# Patient Record
Sex: Male | Born: 1955 | Race: White | Hispanic: No | Marital: Married | Smoking: Never smoker
Health system: Southern US, Community
[De-identification: ages and names within clinical notes are randomized; demographics above are authoritative.]

## PROBLEM LIST (undated history)

## (undated) DIAGNOSIS — I4891 Unspecified atrial fibrillation: Secondary | ICD-10-CM

## (undated) DIAGNOSIS — K219 Gastro-esophageal reflux disease without esophagitis: Secondary | ICD-10-CM

## (undated) DIAGNOSIS — J45909 Unspecified asthma, uncomplicated: Secondary | ICD-10-CM

## (undated) DIAGNOSIS — M1990 Unspecified osteoarthritis, unspecified site: Secondary | ICD-10-CM

## (undated) HISTORY — DX: Unspecified osteoarthritis, unspecified site: M19.90

## (undated) HISTORY — PX: CARDIAC CATHETERIZATION: SHX172

## (undated) HISTORY — PX: ABLATION: SHX5711

## (undated) HISTORY — PX: VASECTOMY: SHX75

## (undated) HISTORY — DX: Unspecified asthma, uncomplicated: J45.909

---

## 2014-05-15 ENCOUNTER — Encounter (HOSPITAL_BASED_OUTPATIENT_CLINIC_OR_DEPARTMENT_OTHER): Payer: Self-pay | Admitting: Emergency Medicine

## 2014-05-15 ENCOUNTER — Emergency Department (HOSPITAL_BASED_OUTPATIENT_CLINIC_OR_DEPARTMENT_OTHER)
Admission: EM | Admit: 2014-05-15 | Discharge: 2014-05-16 | Disposition: A | Discharge status: Home / Self Care | Payer: 59 | Attending: Emergency Medicine | Admitting: Emergency Medicine

## 2014-05-15 DIAGNOSIS — I4891 Unspecified atrial fibrillation: Secondary | ICD-10-CM | POA: Diagnosis not present

## 2014-05-15 DIAGNOSIS — R002 Palpitations: Secondary | ICD-10-CM | POA: Insufficient documentation

## 2014-05-15 DIAGNOSIS — Z79899 Other long term (current) drug therapy: Secondary | ICD-10-CM | POA: Diagnosis not present

## 2014-05-15 DIAGNOSIS — K219 Gastro-esophageal reflux disease without esophagitis: Secondary | ICD-10-CM | POA: Diagnosis not present

## 2014-05-15 DIAGNOSIS — Z880 Allergy status to penicillin: Secondary | ICD-10-CM | POA: Diagnosis not present

## 2014-05-15 HISTORY — DX: Gastro-esophageal reflux disease without esophagitis: K21.9

## 2014-05-15 HISTORY — DX: Unspecified atrial fibrillation: I48.91

## 2014-05-15 LAB — CBC WITH DIFFERENTIAL/PLATELET
Basophils Absolute: 0 10*3/uL (ref 0.0–0.1)
Basophils Relative: 1 % (ref 0–1)
EOS PCT: 4 % (ref 0–5)
Eosinophils Absolute: 0.3 10*3/uL (ref 0.0–0.7)
HCT: 45.7 % (ref 39.0–52.0)
Hemoglobin: 16 g/dL (ref 13.0–17.0)
Lymphocytes Relative: 38 % (ref 12–46)
Lymphs Abs: 2.4 10*3/uL (ref 0.7–4.0)
MCH: 33.5 pg (ref 26.0–34.0)
MCHC: 35 g/dL (ref 30.0–36.0)
MCV: 95.6 fL (ref 78.0–100.0)
Monocytes Absolute: 1.1 10*3/uL — ABNORMAL HIGH (ref 0.1–1.0)
Monocytes Relative: 18 % — ABNORMAL HIGH (ref 3–12)
Neutro Abs: 2.5 10*3/uL (ref 1.7–7.7)
Neutrophils Relative %: 39 % — ABNORMAL LOW (ref 43–77)
PLATELETS: 205 10*3/uL (ref 150–400)
RBC: 4.78 MIL/uL (ref 4.22–5.81)
RDW: 13 % (ref 11.5–15.5)
WBC: 6.4 10*3/uL (ref 4.0–10.5)

## 2014-05-15 MED ORDER — DILTIAZEM HCL 25 MG/5ML IV SOLN
20.0000 mg | Freq: Once | INTRAVENOUS | Status: AC
  Administered 2014-05-16: 10 mg via INTRAVENOUS
  Filled 2014-05-15: qty 5

## 2014-05-15 NOTE — ED Notes (Signed)
Pt states that his HR went to 112 around 4pm.  Pt has hx of a-fib.  No CP or sob with this.

## 2014-05-15 NOTE — ED Notes (Signed)
Pt with hx of a-fib.  Pt states that he normally is in sinus rhythm while on his medication and can usually get back into NSR when he gets into a-fib by taking a cardizem prn and/or taking benadryl.  Pt states that he tried taking the benadryl today without success, he didn't take the cardizem

## 2014-05-15 NOTE — ED Provider Notes (Signed)
CSN: [id number]     Arrival date & time 05/15/14  2244 History   First MD Initiated Contact with Patient 05/15/14 2324   This chart was scribed for Hanley Seamen, MD by Tonye Royalty, ED Scribe. This patient was seen in room MH01/MH01 and the patient's care was started at 11:33 PM.     Chief Complaint  Patient presents with  . Palpitations   HPI  HPI Comments: Steven Montgomery is a 58 y.o. male with a history of atrial fibrillation who presents to the Emergency Department complaining of palpitations with onset 4PM. He describes this as a sensation of a rapid heartbeat.  He states his heart rate measured at 112 and BP was somewhat elevated so he took Warfarin, Propafenone, and Benadryl without remission of symptoms. He states his heart rate has decreased since but is still elevated, never below 95. He reports some transient cold sweat and lightheadedness that have resolved. He denies chest pain, shortness of breath, nausea or vomiting.   Past Medical History  Diagnosis Date  . Atrial fibrillation   . GERD (gastroesophageal reflux disease)    Past Surgical History  Procedure Laterality Date  . Vasectomy    . Cardiac catheterization     No family history on file. History  Substance Use Topics  . Smoking status: Never Smoker   . Smokeless tobacco: Not on file  . Alcohol Use: No    Review of Systems A complete 10 system review of systems was obtained and all systems are negative except as noted in the HPI and PMH.    Allergies  Crestor; Penicillins; and Sudafed  Home Medications   Prior to Admission medications   Medication Sig Start Date End Date Taking? Authorizing Provider  diltiazem (CARDIZEM) 30 MG tablet Take 30 mg by mouth.   Yes Historical Provider, MD  escitalopram (LEXAPRO) 20 MG tablet Take 20 mg by mouth daily.   Yes Historical Provider, MD  esomeprazole (NEXIUM) 40 MG packet Take 40 mg by mouth daily before breakfast.   Yes Historical Provider, MD  loratadine (CLARITIN)  10 MG tablet Take 10 mg by mouth daily. Takes generic   Yes Historical Provider, MD  PRENAT-FECBN-FEBISG-FA-FISHOIL PO Take by mouth.   Yes Historical Provider, MD  propafenone (RYTHMOL) 150 MG tablet Take 150 mg by mouth every 8 (eight) hours.   Yes Historical Provider, MD  ramelteon (ROZEREM) 8 MG tablet Take 8 mg by mouth at bedtime.   Yes Historical Provider, MD  warfarin (COUMADIN) 5 MG tablet Take 5 mg by mouth daily.  m, w, f, Saturday and Sunday. 2.5mg  on Tuesday and thursday   Yes Historical Provider, MD   BP 109/72  Pulse 72  Temp(Src) 97.7 F (36.5 C) (Oral)  Resp 10  Ht  (1.88 m)  Wt 215 lb (97.523 kg)  BMI 27.59 kg/m2  SpO2 94% Physical Exam  Nursing note and vitals reviewed.  General: Well-developed, well-nourished male in no acute distress; appearance consistent with age of record HENT: normocephalic; atraumatic Eyes: pupils equal, round and reactive to light; extraocular muscles intact Neck: supple Heart: irregular rhythm, tachycardia  Lungs: clear to auscultation bilaterally Abdomen: soft; nondistended; nontender; no masses or hepatosplenomegaly; bowel sounds present Extremities: No deformity; full range of motion; pulses normal Neurologic: Awake, alert and oriented; motor function intact in all extremities and symmetric; no facial droop Skin: Warm and dry; a few scattered scaly plaques Psychiatric: Normal mood and affect    ED Course  Procedures (including  critical care time)  MDM   Nursing notes and vitals signs, including pulse oximetry, reviewed.  Summary of this visit's results, reviewed by myself:  EKG Interpretation:  Date & Time: 05/15/2014 22:52  Rate: 107  Rhythm: atrial fibrillation with RVR  QRS Axis: indeterminate  Intervals: normal  ST/T Wave abnormalities: normal  Conduction Disutrbances:left anterior fascicular block  Narrative Interpretation:   Old EKG Reviewed: none available  Labs:  Results for orders placed during the  hospital encounter of 05/15/14 (from the past 24 hour(s))  CBC WITH DIFFERENTIAL     Status: Abnormal   Collection Time    05/15/14 11:50 PM      Result Value Ref Range   WBC 6.4  4.0 - 10.5 K/uL   RBC 4.78  4.22 - 5.81 MIL/uL   Hemoglobin 16.0  13.0 - 17.0 g/dL   HCT 16.1  09.6 - 04.5 %   MCV 95.6  78.0 - 100.0 fL   MCH 33.5  26.0 - 34.0 pg   MCHC 35.0  30.0 - 36.0 g/dL   RDW 40.9  81.1 - 91.4 %   Platelets 205  150 - 400 K/uL   Neutrophils Relative % 39 (*) 43 - 77 %   Neutro Abs 2.5  1.7 - 7.7 K/uL   Lymphocytes Relative 38  12 - 46 %   Lymphs Abs 2.4  0.7 - 4.0 K/uL   Monocytes Relative 18 (*) 3 - 12 %   Monocytes Absolute 1.1 (*) 0.1 - 1.0 K/uL   Eosinophils Relative 4  0 - 5 %   Eosinophils Absolute 0.3  0.0 - 0.7 K/uL   Basophils Relative 1  0 - 1 %   Basophils Absolute 0.0  0.0 - 0.1 K/uL  PROTIME-INR     Status: Abnormal   Collection Time    05/15/14 11:50 PM      Result Value Ref Range   Prothrombin Time 22.8 (*) 11.6 - 15.2 seconds   INR 2.01 (*) 0.00 - 1.49  BASIC METABOLIC PANEL     Status: Abnormal   Collection Time    05/15/14 11:50 PM      Result Value Ref Range   Sodium 141  137 - 147 mEq/L   Potassium 4.2  3.7 - 5.3 mEq/L   Chloride 104  96 - 112 mEq/L   CO2 22  19 - 32 mEq/L   Glucose, Bld 110 (*) 70 - 99 mg/dL   BUN 20  6 - 23 mg/dL   Creatinine, Ser 7.82  0.50 - 1.35 mg/dL   Calcium 8.9  8.4 - 95.6 mg/dL   GFR calc non Af Amer 65 (*) >90 mL/min   GFR calc Af Amer 76 (*) >90 mL/min   Anion gap 15  5 - 15  TROPONIN I     Status: None   Collection Time    05/15/14 11:50 PM      Result Value Ref Range   Troponin I <0.30  <0.30 ng/mL   12:49 AM Rate controlled in the 70s after IV Cardizem. Patient feels much better. He is not orthostatic.  I personally performed the services described in this documentation, which was scribed in my presence. The recorded information has been reviewed and is accurate.    Hanley Seamen, MD 05/16/14 [phone]

## 2014-05-16 LAB — PROTIME-INR
INR: 2.01 — ABNORMAL HIGH (ref 0.00–1.49)
PROTHROMBIN TIME: 22.8 s — AB (ref 11.6–15.2)

## 2014-05-16 LAB — BASIC METABOLIC PANEL
Anion gap: 15 (ref 5–15)
BUN: 20 mg/dL (ref 6–23)
CO2: 22 meq/L (ref 19–32)
CREATININE: 1.2 mg/dL (ref 0.50–1.35)
Calcium: 8.9 mg/dL (ref 8.4–10.5)
Chloride: 104 mEq/L (ref 96–112)
GFR calc non Af Amer: 65 mL/min — ABNORMAL LOW (ref >90)
GFR, EST AFRICAN AMERICAN: 76 mL/min — AB (ref >90)
Glucose, Bld: 110 mg/dL — ABNORMAL HIGH (ref 70–99)
Potassium: 4.2 mEq/L (ref 3.7–5.3)
Sodium: 141 mEq/L (ref 137–147)

## 2014-05-16 LAB — TROPONIN I: Troponin I: <0.3 ng/mL (ref <0.30)

## 2014-05-16 NOTE — Discharge Instructions (Signed)

## 2017-01-04 ENCOUNTER — Encounter (HOSPITAL_BASED_OUTPATIENT_CLINIC_OR_DEPARTMENT_OTHER): Payer: Self-pay | Admitting: *Deleted

## 2017-01-04 ENCOUNTER — Emergency Department (HOSPITAL_BASED_OUTPATIENT_CLINIC_OR_DEPARTMENT_OTHER): Payer: 59

## 2017-01-04 ENCOUNTER — Emergency Department (HOSPITAL_BASED_OUTPATIENT_CLINIC_OR_DEPARTMENT_OTHER)
Admission: EM | Admit: 2017-01-04 | Discharge: 2017-01-04 | Disposition: A | Discharge status: Home / Self Care | Payer: 59 | Attending: Emergency Medicine | Admitting: Emergency Medicine

## 2017-01-04 DIAGNOSIS — M79644 Pain in right finger(s): Secondary | ICD-10-CM | POA: Diagnosis not present

## 2017-01-04 MED ORDER — PREDNISONE 10 MG PO TABS: 40.0000 mg | ORAL_TABLET | Freq: Every day | ORAL | 0 refills | Status: AC

## 2017-01-04 NOTE — ED Provider Notes (Signed)
[facility] Provider Note   CSN: [id number] Arrival date & time: 01/04/17  1810   By signing my name below, I, Thelma Barge, attest that this documentation has been prepared under the direction and in the presence of [facility], PA-C. Electronically Signed: Thelma Barge, Scribe. 01/04/17. 9:20 PM.  History   Chief Complaint Chief Complaint  Patient presents with  . Finger Injury   The history is provided by the patient. No language interpreter was used.   HPI Comments: Steven Montgomery is a 61 y.o. male with a PMHx of AFIB on Coumadin, and arthritis, who presents to the Emergency Department complaining of gradually worsening pain in his right index finger that began last night. He has associated swelling and limited ROM secondary to his swelling. No recent falls or trauma to the area to precipitate his symptoms. He has applied Voltaren gel and taken tylenol with heat and ice with some relief. No h/o gout or RA. No recent increased EtOH or red meat consumption. Pt denies any known insect, arachnid, or tick bites. He denies any injury, numbness/tingling, weakness, changes to diet, fever, chills, headache, neck pain, back pain, abdominal pain, nausea, vomiting.   Past Medical History:  Diagnosis Date  . Atrial fibrillation (HCC)   . GERD (gastroesophageal reflux disease)     There are no active problems to display for this patient.   Past Surgical History:  Procedure Laterality Date  . CARDIAC CATHETERIZATION    . VASECTOMY         Home Medications    Prior to Admission medications   Medication Sig Start Date End Date Taking? Authorizing Provider  diltiazem (CARDIZEM) 30 MG tablet Take 30 mg by mouth.    Historical Provider, MD  escitalopram (LEXAPRO) 20 MG tablet Take 20 mg by mouth daily.    Historical Provider, MD  esomeprazole (NEXIUM) 40 MG packet Take 40 mg by mouth daily before breakfast.    Historical Provider, MD  loratadine (CLARITIN) 10 MG tablet Take 10  mg by mouth daily. Takes generic    Historical Provider, MD  predniSONE (DELTASONE) 10 MG tablet Take 4 tablets (40 mg total) by mouth daily. 01/04/17 01/09/17  Aadam Zhen A Dahiana Kulak, PA-C  propafenone (RYTHMOL) 150 MG tablet Take 150 mg by mouth every 8 (eight) hours.    Historical Provider, MD  ramelteon (ROZEREM) 8 MG tablet Take 8 mg by mouth at bedtime.    Historical Provider, MD  warfarin (COUMADIN) 5 MG tablet Take 5 mg by mouth daily.  m, w, f, Saturday and Sunday. 2.5mg  on Tuesday and thursday    Historical Provider, MD    Family History History reviewed. No pertinent family history.  Social History Social History  Substance Use Topics  . Smoking status: Never Smoker  . Smokeless tobacco: Never Used  . Alcohol use No     Allergies   Crestor [rosuvastatin]; Penicillins; and Sudafed [pseudoephedrine hcl]   Review of Systems Review of Systems  Constitutional: Negative for chills and fever.  Respiratory: Negative for shortness of breath.   Cardiovascular: Negative for chest pain.  Gastrointestinal: Negative for abdominal pain, nausea and vomiting.  Genitourinary: Negative for dysuria and hematuria.  Musculoskeletal: Positive for arthralgias and joint swelling. Negative for back pain and neck pain.  Skin: Negative for rash.  Neurological: Negative for numbness.     Physical Exam Updated Vital Signs BP 124/83 (BP Location: Right Arm)   Pulse 75   Temp 98.5 F (36.9 C) (Oral)  Resp 16   Ht  (1.88 m)   Wt 106.6 kg   SpO2 95%   BMI 30.17 kg/m   Physical Exam  Constitutional: He is oriented to person, place, and time. He appears well-developed and well-nourished.  HENT:  Head: Normocephalic and atraumatic.  Eyes: Conjunctivae are normal. Right eye exhibits no discharge. Left eye exhibits no discharge. No scleral icterus.  Neck: No JVD present. No tracheal deviation present.  Cardiovascular: Normal rate and intact distal pulses.   2+ radial pulses bilaterally    Pulmonary/Chest: Effort normal.  Abdominal: There is no tenderness.  Musculoskeletal:  Swelling to right first digit, limited ROM on extension, good grip strength, maximally TTP along MCP joint of right first digit, no erythema, no warmth, no deformity or crepitus noted 5/5 strength of bilateral wrists and digits against resistance  Neurological: He is alert and oriented to person, place, and time. No sensory deficit.  Fluent speech, no facial droop, sensation intact globally,   Skin: Skin is warm and dry. Capillary refill takes less than 2 seconds.  Psychiatric: He has a normal mood and affect. His behavior is normal.  Nursing note and vitals reviewed.    ED Treatments / Results  DIAGNOSTIC STUDIES: Oxygen Saturation is 96% on RA, normal by my interpretation.    COORDINATION OF CARE: 8:57 PM Discussed treatment plan with pt at bedside and pt agreed to plan.   Labs (all labs ordered are listed, but only abnormal results are displayed) Labs Reviewed - No data to display  EKG  EKG Interpretation None       Radiology Dg Hand Complete Right  Result Date: 01/04/2017 CLINICAL DATA:  Right index finger swelling and pain since last evening. EXAM: RIGHT HAND - COMPLETE 3+ VIEW COMPARISON:  None. FINDINGS: Mild joint space narrowing of the DIP joints of the second through fifth digits, first and fifth MCP joints with minimal spurring along the ulnar aspect of the second and third proximal phalanges. Small subchondral cysts noted of the second metacarpal head. Small marginal and extra-articular erosions of the head of the third middle phalanx. Slight remodeled appearance of the distal radioulnar joint may represent changes of ulnar impingement. No acute fracture identified. A nutrient foramen is seen simulating a fracture of the second proximal phalanx. IMPRESSION: Negative for acute fracture or dislocations. Osteoarthritis of the right hand. Electronically Signed   By: Tollie Eth M.D.    On: 01/04/2017 21:06    Procedures Procedures (including critical care time)  Medications Ordered in ED Medications - No data to display   Initial Impression / Assessment and Plan / ED Course  I have reviewed the triage vital signs and the nursing notes.  Pertinent labs & imaging results that were available during my care of the patient were reviewed by me and considered in my medical decision making (see chart for details).      patient with acute onset swelling and pain to right first digit, maximally tender along the MCP. Patient afebrile, with stable vital signs.  Topical anti-inflammatories have been helpful.  No erythema or warmth. No history of trauma. Neurovascularly intact.  Low suspicion septic arthritis, osteomyelitis, flexor tenosynovitis, or felon. Possible gout. Will start on prednisone pulse for 5 days. Patient will call his cardiologist to see if he needs to adjust his Coumadin dose.  He will continue with Voltaren gel and Tylenol 3 as needed.  Advised to use heat or ice additionally. He will follow up with his primary care  if symptoms do not improve.  Discussed strict ED return precautions. Pt verbalized understanding of and agreement with plan and is safe for discharge home at this time.   Final Clinical Impressions(s) / ED Diagnoses   Final diagnoses:  Finger pain, right    New Prescriptions Discharge Medication List as of 01/04/2017  9:22 PM    START taking these medications   Details  predniSONE (DELTASONE) 10 MG tablet Take 4 tablets (40 mg total) by mouth daily., Starting Fri 01/04/2017, Until Wed 01/09/2017, Print      I personally performed the services described in this documentation, which was scribed in my presence. The recorded information has been reviewed and is accurate.     Jeanie Sewer, PA-C 01/05/17 0130    Vanetta Mulders, MD 01/14/17 [phone]

## 2017-01-04 NOTE — ED Triage Notes (Signed)
Right index finger swelling and pain x 2-3 days.

## 2017-01-04 NOTE — Discharge Instructions (Signed)
Take prednisone once daily for the next 5 days. Continue to use Voltaren gel, Tylenol 3, ice and heat to the affected area. Follow-up with your primary care in the next 3-4 days for reevaluation. Call your cardiologist to see if you need any medication adjustments with your warfarin. Return to the ED if you develop any concerning symptoms

## 2017-01-04 NOTE — ED Notes (Signed)
c/o R index finger pain, swelling, weakness, and possible bruising, onset 2-3 days ago, pinpoints to b/w the R index finger MCP and PIP joint, worse with movement, no known injury, no redness or heat, some relief with voltaren gel  Alert, NAD, calm, interactive, resps e/u, speaking in clear complete sentences, no dyspnea noted, skin W&D, VSS, (denies: other sx).

## 2019-09-11 HISTORY — PX: KNEE ARTHROSCOPY W/ MENISCAL REPAIR: SHX1877

## 2020-04-28 ENCOUNTER — Emergency Department (HOSPITAL_BASED_OUTPATIENT_CLINIC_OR_DEPARTMENT_OTHER): Payer: 59

## 2020-04-28 ENCOUNTER — Other Ambulatory Visit: Payer: Self-pay

## 2020-04-28 ENCOUNTER — Encounter (HOSPITAL_BASED_OUTPATIENT_CLINIC_OR_DEPARTMENT_OTHER): Payer: Self-pay

## 2020-04-28 ENCOUNTER — Emergency Department (HOSPITAL_BASED_OUTPATIENT_CLINIC_OR_DEPARTMENT_OTHER)
Admission: EM | Admit: 2020-04-28 | Discharge: 2020-04-28 | Disposition: A | Discharge status: Home / Self Care | Payer: 59 | Attending: Emergency Medicine | Admitting: Emergency Medicine

## 2020-04-28 DIAGNOSIS — G4489 Other headache syndrome: Secondary | ICD-10-CM | POA: Insufficient documentation

## 2020-04-28 DIAGNOSIS — R519 Headache, unspecified: Secondary | ICD-10-CM | POA: Diagnosis present

## 2020-04-28 DIAGNOSIS — Z79899 Other long term (current) drug therapy: Secondary | ICD-10-CM | POA: Insufficient documentation

## 2020-04-28 MED ORDER — TRAMADOL HCL 50 MG PO TABS: 50.0000 mg | ORAL_TABLET | Freq: Four times a day (QID) | ORAL | 0 refills | Status: AC | PRN

## 2020-04-28 MED FILL — traMADol HCL 50 MG TABS: 50 | 3 days supply | Qty: 10 | Fill #0

## 2020-04-28 NOTE — ED Triage Notes (Signed)
Pt arrives with c/o headache waking up around 430 this morning. Pt reports he took 3 capsules of tylenol PTA, unsure of mg. Pt reports he is on a blood thinner and has history of A Fib.

## 2020-04-28 NOTE — ED Provider Notes (Signed)
[facility] EMERGENCY DEPARTMENT Provider Note   CSN: [id number] Arrival date & time: 04/28/20  7829     History Chief Complaint  Patient presents with  . Headache    Steven Montgomery is a 64 y.o. male.  Patient awoke this morning with headache pressure around the left eye.  And with feeling of some sinus pressure.  Patient states that he usually gets sinus infections related to allergies in the spring and fall.  No other complaints.  States headache is not severe.  Did take Tylenol at home.  Patient is on Coumadin for atrial fib.  Patient denies any visual changes any speech problems any numbness or any weakness.  No neck pain no neck stiffness no fevers.  Patient felt fine yesterday.        Past Medical History:  Diagnosis Date  . Atrial fibrillation (HCC)   . GERD (gastroesophageal reflux disease)     There are no problems to display for this patient.   Past Surgical History:  Procedure Laterality Date  . ABLATION    . CARDIAC CATHETERIZATION    . KNEE ARTHROSCOPY W/ MENISCAL REPAIR  2021  . VASECTOMY         No family history on file.  Social History   Tobacco Use  . Smoking status: Never Smoker  . Smokeless tobacco: Never Used  Substance Use Topics  . Alcohol use: No  . Drug use: No    Home Medications Prior to Admission medications   Medication Sig Start Date End Date Taking? Authorizing Provider  Acetaminophen-Codeine 300-30 MG tablet Take 1 tablet by mouth 3 (three) times daily as needed. 11/28/16  Yes [provider]  atorvastatin (LIPITOR) 40 MG tablet Take by mouth. 04/17/17  Yes [provider]  budesonide-formoterol (SYMBICORT) 160-4.5 MCG/ACT inhaler USE 2 INHALATIONS BY MOUTH  TWICE DAILY 08/24/19  Yes [provider]  diltiazem (TIAZAC) 180 MG 24 hr capsule Take by mouth. 08/11/19  Yes [provider]  dronedarone (MULTAQ) 400 MG tablet Take 1 tablet by mouth 2 (two) times daily. 09/21/19  Yes [provider]  gabapentin (NEURONTIN) 300 MG capsule TAKE ONE TAB NIGHTLY AS NEEDED. MAY TAKE ONE TABLET THREE TIMES A DAY AS NEEDED 03/07/20  Yes [provider]  ascorbic acid (VITAMIN C) 100 MG tablet Take by mouth.    [provider]  diltiazem (CARDIZEM) 30 MG tablet Take 30 mg by mouth.    [provider]  escitalopram (LEXAPRO) 20 MG tablet Take 20 mg by mouth daily.    [provider]  esomeprazole (NEXIUM) 40 MG packet Take 40 mg by mouth daily before breakfast.    [provider]  loratadine (CLARITIN) 10 MG tablet Take 10 mg by mouth daily. Takes generic    [provider]  propafenone (RYTHMOL) 150 MG tablet Take 150 mg by mouth every 8 (eight) hours.    [provider]  ramelteon (ROZEREM) 8 MG tablet Take 8 mg by mouth at bedtime.    [provider]  traMADol (ULTRAM) 50 MG tablet Take 1 tablet (50 mg total) by mouth every 6 (six) hours as needed. 04/28/20   Vanetta Mulders, MD  warfarin (COUMADIN) 5 MG tablet Take 5 mg by mouth daily. 5mg  m, w, f, Saturday and Sunday. 2.5mg  on Tuesday and thursday    [provider]  warfarin (COUMADIN) 5 MG tablet Take by mouth.    [provider]    Allergies  Crestor [rosuvastatin], Penicillins, and Sudafed [pseudoephedrine hcl]  Review of Systems   Review of Systems  Constitutional: Negative for chills and fever.  HENT: Positive for sinus pressure. Negative for congestion, rhinorrhea and sore throat.   Eyes: Negative for visual disturbance.  Respiratory: Negative for cough and shortness of breath.   Cardiovascular: Negative for chest pain and leg swelling.  Gastrointestinal: Negative for abdominal pain, diarrhea, nausea and vomiting.  Genitourinary: Negative for dysuria.  Musculoskeletal: Negative for back pain and neck pain.  Skin: Negative for rash.  Neurological: Positive for headaches. Negative for dizziness and light-headedness.    Hematological: Does not bruise/bleed easily.  Psychiatric/Behavioral: Negative for confusion.    Physical Exam Updated Vital Signs BP (!) 150/89 (BP Location: Right Arm)   Pulse 72   Temp 98.5 F (36.9 C) (Oral)   Resp 16   Ht 1.88 m (6\' 2" )   Wt 109.2 kg   SpO2 96%   BMI 30.90 kg/m   Physical Exam Vitals and nursing note reviewed.  Constitutional:      General: He is not in acute distress.    Appearance: Normal appearance. He is well-developed.  HENT:     Head: Normocephalic and atraumatic.  Eyes:     Extraocular Movements: Extraocular movements intact.     Conjunctiva/sclera: Conjunctivae normal.     Pupils: Pupils are equal, round, and reactive to light.  Cardiovascular:     Rate and Rhythm: Normal rate and regular rhythm.     Heart sounds: No murmur heard.   Pulmonary:     Effort: Pulmonary effort is normal. No respiratory distress.     Breath sounds: Normal breath sounds.  Abdominal:     Palpations: Abdomen is soft.     Tenderness: There is no abdominal tenderness.  Musculoskeletal:        General: Normal range of motion.     Cervical back: Neck supple.  Skin:    General: Skin is warm and dry.  Neurological:     General: No focal deficit present.     Mental Status: He is alert and oriented to person, place, and time.     Cranial Nerves: No cranial nerve deficit.     Sensory: No sensory deficit.     Motor: No weakness.     Coordination: Coordination normal.     ED Results / Procedures / Treatments   Labs (all labs ordered are listed, but only abnormal results are displayed) Labs Reviewed - No data to display  EKG None  Radiology CT Head Wo Contrast  Result Date: 04/28/2020 CLINICAL DATA:  Headache with intracranial hemorrhage suspected EXAM: CT HEAD WITHOUT CONTRAST TECHNIQUE: Contiguous axial images were obtained from the base of the skull through the vertex without intravenous contrast. COMPARISON:  Head CT report 11/06/2012 FINDINGS: Brain: No  evidence of acute infarction, hemorrhage, hydrocephalus, extra-axial collection or mass lesion/mass effect. Vascular: No hyperdense vessel or unexpected calcification. Skull: Normal. Negative for fracture or focal lesion. Sinuses/Orbits: No acute finding. IMPRESSION: Negative head CT. Electronically Signed   By: 11/08/2012 M.D.   On: 04/28/2020 09:32    Procedures Procedures (including critical care time)  Medications Ordered in ED Medications - No data to display  ED Course  I have reviewed the triage vital signs and the nursing notes.  Pertinent labs & imaging results that were available during my care of the patient were reviewed by me and considered in my medical decision making (see chart for details).  MDM Rules/Calculators/A&P                          Narcotic database reviewed.  Patient does occasionally receive pain medication.  Last prescription was end of June.  For 10 days.  Head CT shows no acute abnormalities no evidence of any head bleed.  Also no evidence of any chronic or acute sinusitis.  Recommend Tylenol and will give a short course of tramadol for additional pain relief.  Patient is retired does not need a work note.  Patient nontoxic no acute distress.  No concerns for meningitis.  No concerns for stroke.     Final Clinical Impression(s) / ED Diagnoses Final diagnoses:  Other headache syndrome    Rx / DC Orders ED Discharge Orders         Ordered    traMADol (ULTRAM) 50 MG tablet  Every 6 hours PRN        04/28/20 1024           Vanetta Mulders, MD 04/28/20 1027

## 2020-04-28 NOTE — Discharge Instructions (Addendum)
CT head without any acute findings.  Sinuses were normal.  I take the tramadol as a supplement to the Tylenol as needed for more severe pain.  Follow-up with your doctors if headache does not resolve additional work-up will be required.  Return for any new or worse symptoms.

## 2020-08-02 ENCOUNTER — Emergency Department (HOSPITAL_BASED_OUTPATIENT_CLINIC_OR_DEPARTMENT_OTHER)
Admission: EM | Admit: 2020-08-02 | Discharge: 2020-08-02 | Disposition: A | Discharge status: Home / Self Care | Payer: 59 | Attending: Emergency Medicine | Admitting: Emergency Medicine

## 2020-08-02 ENCOUNTER — Other Ambulatory Visit: Payer: Self-pay

## 2020-08-02 ENCOUNTER — Encounter (HOSPITAL_BASED_OUTPATIENT_CLINIC_OR_DEPARTMENT_OTHER): Payer: Self-pay | Admitting: *Deleted

## 2020-08-02 DIAGNOSIS — Y93G1 Activity, food preparation and clean up: Secondary | ICD-10-CM | POA: Diagnosis not present

## 2020-08-02 DIAGNOSIS — W268XXA Contact with other sharp object(s), not elsewhere classified, initial encounter: Secondary | ICD-10-CM | POA: Insufficient documentation

## 2020-08-02 DIAGNOSIS — Z79899 Other long term (current) drug therapy: Secondary | ICD-10-CM | POA: Diagnosis not present

## 2020-08-02 DIAGNOSIS — S6991XA Unspecified injury of right wrist, hand and finger(s), initial encounter: Secondary | ICD-10-CM | POA: Diagnosis present

## 2020-08-02 DIAGNOSIS — Z7901 Long term (current) use of anticoagulants: Secondary | ICD-10-CM | POA: Diagnosis not present

## 2020-08-02 DIAGNOSIS — Z23 Encounter for immunization: Secondary | ICD-10-CM | POA: Insufficient documentation

## 2020-08-02 DIAGNOSIS — S61011A Laceration without foreign body of right thumb without damage to nail, initial encounter: Secondary | ICD-10-CM | POA: Insufficient documentation

## 2020-08-02 MED ORDER — CEPHALEXIN 500 MG PO CAPS: 500.0000 mg | ORAL_CAPSULE | Freq: Four times a day (QID) | ORAL | 0 refills | Status: DC

## 2020-08-02 MED ORDER — TETANUS-DIPHTH-ACELL PERTUSSIS 5-2.5-18.5 LF-MCG/0.5 IM SUSY
0.5000 mL | PREFILLED_SYRINGE | Freq: Once | INTRAMUSCULAR | Status: AC
  Administered 2020-08-02: 0.5 mL via INTRAMUSCULAR
  Filled 2020-08-02: qty 0.5

## 2020-08-02 MED ORDER — LIDOCAINE HCL (PF) 1 % IJ SOLN
5.0000 mL | Freq: Once | INTRAMUSCULAR | Status: DC
  Filled 2020-08-02: qty 5

## 2020-08-02 NOTE — ED Triage Notes (Signed)
C/o right thumb injury by metal slicer x 10 mins ago

## 2020-08-02 NOTE — Discharge Instructions (Addendum)
WOUND CARE Please return in 7  days to have your stitches/staples removed or sooner if you have concerns.  Keep area clean and dry for 24 hours. Do not remove bandage, if applied.  After 24 hours, remove bandage and wash wound gently with mild soap and warm water. Reapply a new bandage after cleaning wound, if directed.  Continue daily cleansing with soap and water until stitches/staples are removed.  Do not apply any ointments or creams to the wound while stitches/staples are in place, as this may cause delayed healing.  Notify the office if you experience any of the following signs of infection: Swelling, redness, pus drainage, streaking, fever >101.0 F  Notify the office if you experience excessive bleeding that does not stop after 15-20 minutes of constant, firm pressure. Take your antibiotics as directed, you do have a penicillin allergy but if you have used amoxicillin and cephalosporins before the Keflex should be fine, if you do start having a reaction to it please stop taking and come back to the emergency department. Please come back to the emerge department for any new or worsening concerning symptoms.

## 2020-08-02 NOTE — ED Provider Notes (Addendum)
[facility] EMERGENCY DEPARTMENT Provider Note   CSN: [id number] Arrival date & time: 08/02/20  1750     History Chief Complaint  Patient presents with  . Finger Injury    Steven Montgomery is a 64 y.o. male with pertinent past medical history of A. fib on warfarin that presents the emerge department today for right thumb injury.  Patient states that he was preparing Thanksgiving dinner and got injured by a metal slicer, this happened about an hour ago.  Patient states that bleeding was controlled, did thoroughly wash it under the sink.  Patient states that he has some pain in that area, no numbness or tingling.  Able to bend thumb in all directions, no weakness.  Denies any prior injury to the thumb.  Patient has not been updated on his tetanus for the last 10 years.  Denies any prior injury.  HPI     Past Medical History:  Diagnosis Date  . Atrial fibrillation (HCC)   . GERD (gastroesophageal reflux disease)     There are no problems to display for this patient.   Past Surgical History:  Procedure Laterality Date  . ABLATION    . CARDIAC CATHETERIZATION    . KNEE ARTHROSCOPY W/ MENISCAL REPAIR  2021  . VASECTOMY         No family history on file.  Social History   Tobacco Use  . Smoking status: Never Smoker  . Smokeless tobacco: Never Used  Substance Use Topics  . Alcohol use: No  . Drug use: No    Home Medications Prior to Admission medications   Medication Sig Start Date End Date Taking? Authorizing Provider  Acetaminophen-Codeine 300-30 MG tablet Take 1 tablet by mouth 3 (three) times daily as needed. 11/28/16   [provider]  ascorbic acid (VITAMIN C) 100 MG tablet Take by mouth.    [provider]  atorvastatin (LIPITOR) 40 MG tablet Take by mouth. 04/17/17   [provider]  budesonide-formoterol (SYMBICORT) 160-4.5 MCG/ACT inhaler USE 2 INHALATIONS BY MOUTH  TWICE DAILY 08/24/19   [provider]  cephALEXin  (KEFLEX) 500 MG capsule Take 1 capsule (500 mg total) by mouth 4 (four) times daily. 08/02/20   Farrel Gordon, PA-C  diltiazem (CARDIZEM) 30 MG tablet Take 30 mg by mouth.    [provider]  diltiazem (TIAZAC) 180 MG 24 hr capsule Take by mouth. 08/11/19   [provider]  dronedarone (MULTAQ) 400 MG tablet Take 1 tablet by mouth 2 (two) times daily. 09/21/19   [provider]  escitalopram (LEXAPRO) 20 MG tablet Take 20 mg by mouth daily.    [provider]  esomeprazole (NEXIUM) 40 MG packet Take 40 mg by mouth daily before breakfast.    [provider]  gabapentin (NEURONTIN) 300 MG capsule TAKE ONE TAB NIGHTLY AS NEEDED. MAY TAKE ONE TABLET THREE TIMES A DAY AS NEEDED 03/07/20   [provider]  loratadine (CLARITIN) 10 MG tablet Take 10 mg by mouth daily. Takes generic    [provider]  propafenone (RYTHMOL) 150 MG tablet Take 150 mg by mouth every 8 (eight) hours.    [provider]  ramelteon (ROZEREM) 8 MG tablet Take 8 mg by mouth at bedtime.    [provider]  traMADol (ULTRAM) 50 MG tablet Take 1 tablet (50 mg total) by mouth every 6 (six) hours as needed. 04/28/20   Vanetta Mulders, MD  warfarin (COUMADIN) 5 MG tablet Take 5  mg by mouth daily. 5mg  m, w, f, Saturday and Sunday. 2.5mg  on Tuesday and thursday    [provider]  warfarin (COUMADIN) 5 MG tablet Take by mouth.    [provider]    Allergies    Crestor [rosuvastatin], Penicillins, and Sudafed [pseudoephedrine hcl]  Review of Systems   Review of Systems  Constitutional: Negative for diaphoresis, fatigue and fever.  Eyes: Negative for visual disturbance.  Respiratory: Negative for shortness of breath.   Cardiovascular: Negative for chest pain.  Gastrointestinal: Negative for nausea and vomiting.  Musculoskeletal: Negative for back pain and myalgias.  Skin: Positive for wound. Negative for color change, pallor and rash.    Neurological: Negative for syncope, weakness, light-headedness, numbness and headaches.  Psychiatric/Behavioral: Negative for behavioral problems and confusion.    Physical Exam Updated Vital Signs BP (!) 145/81 (BP Location: Left Arm)   Pulse 81   Temp 99.1 F (37.3 C) (Oral)   Resp 16   Ht 6\' 2"  (1.88 m)   Wt 108.9 kg   SpO2 97%   BMI 30.81 kg/m   Physical Exam Constitutional:      General: He is not in acute distress.    Appearance: Normal appearance. He is not ill-appearing, toxic-appearing or diaphoretic.  HENT:     Head: Normocephalic and atraumatic.  Eyes:     Extraocular Movements: Extraocular movements intact.     Pupils: Pupils are equal, round, and reactive to light.  Cardiovascular:     Rate and Rhythm: Normal rate and regular rhythm.     Pulses: Normal pulses.  Pulmonary:     Effort: Pulmonary effort is normal.     Breath sounds: Normal breath sounds.  Musculoskeletal:        General: Normal range of motion.     Right hand: Laceration present.       Hands:     Comments: Half a centimeter laceration to lateral right thumb, bleeding has been controlled.  Normal sensation and strength to all joints of thumb and hand and wrist.  No bony tenderness.  Cap refill less than 2 seconds.  Radial pulse 2+.  Skin:    General: Skin is warm and dry.     Capillary Refill: Capillary refill takes less than 2 seconds.  Neurological:     General: No focal deficit present.     Mental Status: He is alert and oriented to person, place, and time.  Psychiatric:        Mood and Affect: Mood normal.        Behavior: Behavior normal.        Thought Content: Thought content normal.     ED Results / Procedures / Treatments   Labs (all labs ordered are listed, but only abnormal results are displayed) Labs Reviewed - No data to display  EKG None  Radiology No results found.  Procedures .Marland Kitchen.Laceration Repair  Date/Time: 08/02/2020 7:10 PM Performed by: Farrel GordonPatel, Antoinetta Berrones,  PA-C Authorized by: Farrel GordonPatel, Tavarious Freel, PA-C   Consent:    Consent obtained:  Verbal   Consent given by:  Patient   Risks discussed:  Infection, need for additional repair, pain, poor cosmetic result and poor wound healing   Alternatives discussed:  No treatment and delayed treatment Universal protocol:    Procedure explained and questions answered to patient or proxy's satisfaction: yes     Relevant documents present and verified: yes     Test results available and properly labeled: yes     Imaging  studies available: yes     Required blood products, implants, devices, and special equipment available: yes     Site/side marked: yes     Immediately prior to procedure, a time out was called: yes     Patient identity confirmed:  Verbally with patient Anesthesia (see MAR for exact dosages):    Anesthesia method:  None Laceration details:    Location:  Finger   Finger location:  R thumb   Length (cm):  0.5   Depth (mm):  1 Repair type:    Repair type:  Simple Pre-procedure details:    Preparation:  Imaging obtained to evaluate for foreign bodies Exploration:    Hemostasis achieved with:  Direct pressure   Wound exploration: wound explored through full range of motion and entire depth of wound probed and visualized     Wound extent: no foreign bodies/material noted and no vascular damage noted     Contaminated: no   Treatment:    Amount of cleaning:  Extensive   Irrigation solution:  Sterile water Skin repair:    Repair method:  Sutures   Suture size:  4-0   Suture material:  Nylon   Suture technique:  Simple interrupted   Number of sutures:  2 Approximation:    Approximation:  Close Post-procedure details:    Dressing:  Open (no dressing) Comments:     PA- C Student performed under my supervision.   (including critical care time)  Medications Ordered in ED Medications  lidocaine (PF) (XYLOCAINE) 1 % injection 5 mL (5 mLs Infiltration Not Given 08/02/20 1942)  Tdap  (BOOSTRIX) injection 0.5 mL (0.5 mLs Intramuscular Given 08/02/20 1939)    ED Course  I have reviewed the triage vital signs and the nursing notes.  Pertinent labs & imaging results that were available during my care of the patient were reviewed by me and considered in my medical decision making (see chart for details).    MDM Rules/Calculators/A&P                         Steven Montgomery is a 64 y.o. male with pertinent past medical history of A. fib on warfarin that presents the emerge department today for right thumb injury.  Patient with half centimeter laceration, very superficial to right thumb.  Bleeding has been controlled with pressure and tourniquet.  Patient is distally neurovascularly intact.do not think we need plain films at this time, no concern for retained object since patient sliced himself with a metal slicer and no concern for fracture since patient has normal range of motion to thumb with no pain.  After moderate irrigation PA  student placed 2 sutures under my supervision , on repeat exam patient is still distally neurovascularly intact.  Tetanus updated.  Patient placed on antibiotics and will return to get sutures removed and have wound check.  Symptomatic treatment discussed, strict precautions given.  Patient states that he does have allergic reaction to penicillin, when he was very little.  However has taken amoxicillin in the past and he thinks he has also taken Keflex in the past, will prescribe Keflex at this time.  Side effects discussed including allergic reaction.  Doubt need for further emergent work up at this time. I explained the diagnosis and have given explicit precautions to return to the ER including for any other new or worsening symptoms. The patient understands and accepts the medical plan as it's been dictated and I have answered their  questions. Discharge instructions concerning home care and prescriptions have been given. The patient is STABLE and is  discharged to home in good condition.   Final Clinical Impression(s) / ED Diagnoses Final diagnoses:  Laceration of right thumb without foreign body without damage to nail, initial encounter    Rx / DC Orders ED Discharge Orders         Ordered    cephALEXin (KEFLEX) 500 MG capsule  4 times daily        08/02/20 1943               Farrel Gordon, PA-C 08/02/20 1952    Tilden Fossa, MD 08/02/20 2101

## 2020-08-09 ENCOUNTER — Emergency Department (HOSPITAL_BASED_OUTPATIENT_CLINIC_OR_DEPARTMENT_OTHER)
Admission: EM | Admit: 2020-08-09 | Discharge: 2020-08-09 | Disposition: A | Discharge status: Home / Self Care | Payer: 59 | Attending: Emergency Medicine | Admitting: Emergency Medicine

## 2020-08-09 ENCOUNTER — Other Ambulatory Visit: Payer: Self-pay

## 2020-08-09 ENCOUNTER — Encounter (HOSPITAL_BASED_OUTPATIENT_CLINIC_OR_DEPARTMENT_OTHER): Payer: Self-pay | Admitting: *Deleted

## 2020-08-09 DIAGNOSIS — I4891 Unspecified atrial fibrillation: Secondary | ICD-10-CM | POA: Diagnosis not present

## 2020-08-09 DIAGNOSIS — Z79899 Other long term (current) drug therapy: Secondary | ICD-10-CM | POA: Diagnosis not present

## 2020-08-09 DIAGNOSIS — Z4802 Encounter for removal of sutures: Secondary | ICD-10-CM | POA: Diagnosis present

## 2020-08-09 DIAGNOSIS — W274XXD Contact with kitchen utensil, subsequent encounter: Secondary | ICD-10-CM | POA: Diagnosis not present

## 2020-08-09 NOTE — ED Triage Notes (Signed)
Here for suture removal right thumb.

## 2020-08-09 NOTE — ED Provider Notes (Signed)
[facility] EMERGENCY DEPARTMENT Provider Note   CSN: [id number] Arrival date & time: 08/09/20  1423     History Chief Complaint  Patient presents with  . Suture / Staple Removal    Steven Montgomery is a 64 y.o. male who presents for suture removal 7 days post-placement for 0.5 cm laceration to the right thumb from metal slicer while preparing food in the kitchen.   Patient has completed course of cephalexin, Tetanus was updated at ED visit on 08/02/2020.   Patient denies pain, redness, swelling, drainage of the right thumb or around the wound. Denies fevers or chills.  Patient does state that one of the sutures came loose and fell out today while he was in the shower, and was washed down the drain.   Patient with hx of Afib, on warfarin. Hx of GERD, cardiac cath, knee arthroscopy.  HPI     Past Medical History:  Diagnosis Date  . Atrial fibrillation (HCC)   . GERD (gastroesophageal reflux disease)     There are no problems to display for this patient.   Past Surgical History:  Procedure Laterality Date  . ABLATION    . CARDIAC CATHETERIZATION    . KNEE ARTHROSCOPY W/ MENISCAL REPAIR  2021  . VASECTOMY         No family history on file.  Social History   Tobacco Use  . Smoking status: Never Smoker  . Smokeless tobacco: Never Used  Substance Use Topics  . Alcohol use: No  . Drug use: No    Home Medications Prior to Admission medications   Medication Sig Start Date End Date Taking? Authorizing Provider  Acetaminophen-Codeine 300-30 MG tablet Take 1 tablet by mouth 3 (three) times daily as needed. 11/28/16   [provider]  ascorbic acid (VITAMIN C) 100 MG tablet Take by mouth.    [provider]  atorvastatin (LIPITOR) 40 MG tablet Take by mouth. 04/17/17   [provider]  budesonide-formoterol (SYMBICORT) 160-4.5 MCG/ACT inhaler USE 2 INHALATIONS BY MOUTH  TWICE DAILY 08/24/19   [provider]  cephALEXin  (KEFLEX) 500 MG capsule Take 1 capsule (500 mg total) by mouth 4 (four) times daily. 08/02/20   Farrel Gordon, PA-C  diltiazem (CARDIZEM) 30 MG tablet Take 30 mg by mouth.    [provider]  diltiazem (TIAZAC) 180 MG 24 hr capsule Take by mouth. 08/11/19   [provider]  dronedarone (MULTAQ) 400 MG tablet Take 1 tablet by mouth 2 (two) times daily. 09/21/19   [provider]  escitalopram (LEXAPRO) 20 MG tablet Take 20 mg by mouth daily.    [provider]  esomeprazole (NEXIUM) 40 MG packet Take 40 mg by mouth daily before breakfast.    [provider]  gabapentin (NEURONTIN) 300 MG capsule TAKE ONE TAB NIGHTLY AS NEEDED. MAY TAKE ONE TABLET THREE TIMES A DAY AS NEEDED 03/07/20   [provider]  loratadine (CLARITIN) 10 MG tablet Take 10 mg by mouth daily. Takes generic    [provider]  propafenone (RYTHMOL) 150 MG tablet Take 150 mg by mouth every 8 (eight) hours.    [provider]  ramelteon (ROZEREM) 8 MG tablet Take 8 mg by mouth at bedtime.    [provider]  traMADol (ULTRAM) 50 MG tablet Take 1 tablet (50 mg total) by mouth every 6 (six) hours as needed. 04/28/20   Vanetta Mulders, MD  warfarin (COUMADIN) 5 MG tablet Take 5 mg by  mouth daily. 5mg  m, w, f, Saturday and Sunday. 2.5mg  on Tuesday and thursday    [provider]  warfarin (COUMADIN) 5 MG tablet Take by mouth.    [provider]    Allergies    Crestor [rosuvastatin], Penicillins, and Sudafed [pseudoephedrine hcl]  Review of Systems   Review of Systems  Constitutional: Negative.   Respiratory: Negative.   Cardiovascular: Negative.   Gastrointestinal: Negative.   Musculoskeletal: Negative.   Skin: Positive for wound.       Wound right thumb, one suture present, one fell out spontaneously today at home.   Hematological: Bruises/bleeds easily.       On Warfarin    Physical Exam Updated Vital Signs BP (!) 145/82    Pulse 80   Temp 98.3 F (36.8 C) (Oral)   Resp 18   Ht 6\' 2"  (1.88 m)   Wt 108.9 kg   SpO2 94%   BMI 30.82 kg/m   Physical Exam Vitals and nursing note reviewed.  Constitutional:      Appearance: He is not ill-appearing.  HENT:     Head: Normocephalic and atraumatic.     Mouth/Throat:     Mouth: Mucous membranes are moist.     Pharynx: No oropharyngeal exudate or posterior oropharyngeal erythema.  Eyes:     General:        Right eye: No discharge.        Left eye: No discharge.     Conjunctiva/sclera: Conjunctivae normal.  Cardiovascular:     Rate and Rhythm: Normal rate and regular rhythm.     Pulses: Normal pulses.     Heart sounds: Normal heart sounds. No murmur heard.   Pulmonary:     Effort: Pulmonary effort is normal. No respiratory distress.     Breath sounds: Normal breath sounds. No wheezing or rales.  Abdominal:     General: There is no distension.     Tenderness: There is no abdominal tenderness.  Musculoskeletal:        General: No deformity.       Hands:     Cervical back: Neck supple.     Right lower leg: No edema.     Left lower leg: No edema.     Comments: Only one suture present in the right thumb laceration - per patient other suture fell out spontaneously in the shower this morning. There are no signs of retained suture in the wound.   Skin:    General: Skin is warm and dry.     Capillary Refill: Capillary refill takes less than 2 seconds.  Neurological:     General: No focal deficit present.     Mental Status: He is alert and oriented to person, place, and time.  Psychiatric:        Mood and Affect: Mood normal.     ED Results / Procedures / Treatments   Labs (all labs ordered are listed, but only abnormal results are displayed) Labs Reviewed - No data to display  EKG None  Radiology No results found.  Procedures .Suture Removal  Date/Time: 08/09/2020 2:34 PM Performed by: , PA-C Authorized by: 08/11/2020, PA-C   Consent:    Consent obtained:  Verbal   Consent given by:  Patient   Risks discussed:  Bleeding, wound separation and pain   Alternatives discussed:  No treatment Location:    Location:  Upper extremity   Upper extremity location:  Hand   Hand  location:  R thumb Procedure details:    Wound appearance:  No signs of infection, good wound healing and clean   Number of sutures removed:  1 (Per patient, one suture fell out on its own this morning while in the shower. At time of my exam, there is only 1 suture present in the wound.) Post-procedure details:    Post-removal:  Band-Aid applied   Patient tolerance of procedure:  Tolerated well, no immediate complications   (including critical care time)  Medications Ordered in ED Medications - No data to display  ED Course  I have reviewed the triage vital signs and the nursing notes.  Pertinent labs & imaging results that were available during my care of the patient were reviewed by me and considered in my medical decision making (see chart for details).    MDM Rules/Calculators/A&P                         64 y/o male who presents for suture removal from right thumb.   Sutures removed as above. Patient tolerated procedure well.   Infection precautions discussed, return precautions given. Patient is well appearing and is stable for discharge.   Final Clinical Impression(s) / ED Diagnoses Final diagnoses:  None    Rx / DC Orders ED Discharge Orders    None       Paris Lore, PA-C 08/09/20 1456    Geoffery Lyons, MD 08/10/20 [phone]

## 2020-08-09 NOTE — Discharge Instructions (Signed)
You presented for suture removal today. One of your sutures had fallen out at home; the other was removed easily in the ED.   There are no signs of infection in your thumb; your wound appears to be healing very well. This is good news.   Please present to your primary care doctor or return to the ED if you develop any redness, swelling, pain, drainage, or reopening of your wound, as these could be signs of infection.

## 2021-03-13 ENCOUNTER — Emergency Department (HOSPITAL_BASED_OUTPATIENT_CLINIC_OR_DEPARTMENT_OTHER)
Admission: EM | Admit: 2021-03-13 | Discharge: 2021-03-14 | Disposition: A | Discharge status: Home / Self Care | Payer: 59 | Attending: Emergency Medicine | Admitting: Emergency Medicine

## 2021-03-13 ENCOUNTER — Encounter (HOSPITAL_BASED_OUTPATIENT_CLINIC_OR_DEPARTMENT_OTHER): Payer: Self-pay | Admitting: Emergency Medicine

## 2021-03-13 ENCOUNTER — Other Ambulatory Visit: Payer: Self-pay

## 2021-03-13 DIAGNOSIS — Z7901 Long term (current) use of anticoagulants: Secondary | ICD-10-CM | POA: Diagnosis not present

## 2021-03-13 DIAGNOSIS — S61302A Unspecified open wound of right middle finger with damage to nail, initial encounter: Secondary | ICD-10-CM | POA: Insufficient documentation

## 2021-03-13 DIAGNOSIS — S61209A Unspecified open wound of unspecified finger without damage to nail, initial encounter: Secondary | ICD-10-CM

## 2021-03-13 DIAGNOSIS — W260XXA Contact with knife, initial encounter: Secondary | ICD-10-CM | POA: Diagnosis not present

## 2021-03-13 DIAGNOSIS — Z8679 Personal history of other diseases of the circulatory system: Secondary | ICD-10-CM | POA: Diagnosis not present

## 2021-03-13 DIAGNOSIS — Z79899 Other long term (current) drug therapy: Secondary | ICD-10-CM | POA: Insufficient documentation

## 2021-03-13 DIAGNOSIS — Z23 Encounter for immunization: Secondary | ICD-10-CM | POA: Diagnosis not present

## 2021-03-13 DIAGNOSIS — Y93G1 Activity, food preparation and clean up: Secondary | ICD-10-CM | POA: Diagnosis not present

## 2021-03-13 MED ORDER — TETANUS-DIPHTH-ACELL PERTUSSIS 5-2.5-18.5 LF-MCG/0.5 IM SUSY
PREFILLED_SYRINGE | INTRAMUSCULAR | Status: AC
  Administered 2021-03-13: 1 mL via INTRAMUSCULAR
  Filled 2021-03-13: qty 0.5

## 2021-03-13 NOTE — Discharge Instructions (Addendum)
Local wound care with bacitracin and dressing changes twice daily.  Wear splint for comfort and protection.  Return to the emergency department for any new and/or concerning symptoms.

## 2021-03-13 NOTE — ED Triage Notes (Signed)
Reports cutting right middle finger on knife while making cucumber salad. Sliced part of fat pad off. Takes coumadin. Oozing at this time, cleaned with wound cleanser and dressing applied in triage. Needs tdap updated.

## 2021-03-13 NOTE — ED Provider Notes (Signed)
[facility] EMERGENCY DEPARTMENT Provider Note   CSN: [id number] Arrival date & time: 03/13/21  2317     History Chief Complaint  Patient presents with   Laceration    Steven Montgomery is a 65 y.o. male.  Patient is a 65 year old male with history of atrial fibrillation on Coumadin.  He presents with a finger laceration.  Patient was slicing cucumbers this evening when he sliced the tip off of his finger.  He was having difficulty getting the bleeding stopped secondary to his Coumadin therapy and presents for evaluation of this.  The history is provided by the patient.  Laceration Location: Right middle finger. Depth:  Cutaneous Quality: avulsion and straight   Bleeding: controlled with pressure   Laceration mechanism:  Knife     Past Medical History:  Diagnosis Date   Atrial fibrillation (HCC)    GERD (gastroesophageal reflux disease)     There are no problems to display for this patient.   Past Surgical History:  Procedure Laterality Date   ABLATION     CARDIAC CATHETERIZATION     KNEE ARTHROSCOPY W/ MENISCAL REPAIR  2021   VASECTOMY         History reviewed. No pertinent family history.  Social History   Tobacco Use   Smoking status: Never   Smokeless tobacco: Never  Substance Use Topics   Alcohol use: No   Drug use: No    Home Medications Prior to Admission medications   Medication Sig Start Date End Date Taking? Authorizing Provider  Acetaminophen-Codeine 300-30 MG tablet Take 1 tablet by mouth 3 (three) times daily as needed. 11/28/16   [provider]  ascorbic acid (VITAMIN C) 100 MG tablet Take by mouth.    [provider]  atorvastatin (LIPITOR) 40 MG tablet Take by mouth. 04/17/17   [provider]  budesonide-formoterol (SYMBICORT) 160-4.5 MCG/ACT inhaler USE 2 INHALATIONS BY MOUTH  TWICE DAILY 08/24/19   [provider]  cephALEXin (KEFLEX) 500 MG capsule Take 1 capsule (500 mg total) by mouth 4  (four) times daily. 08/02/20   Farrel Gordon, PA-C  diltiazem (CARDIZEM) 30 MG tablet Take 30 mg by mouth.    [provider]  diltiazem (TIAZAC) 180 MG 24 hr capsule Take by mouth. 08/11/19   [provider]  dronedarone (MULTAQ) 400 MG tablet Take 1 tablet by mouth 2 (two) times daily. 09/21/19   [provider]  escitalopram (LEXAPRO) 20 MG tablet Take 20 mg by mouth daily.    [provider]  esomeprazole (NEXIUM) 40 MG packet Take 40 mg by mouth daily before breakfast.    [provider]  gabapentin (NEURONTIN) 300 MG capsule TAKE ONE TAB NIGHTLY AS NEEDED. MAY TAKE ONE TABLET THREE TIMES A DAY AS NEEDED 03/07/20   [provider]  loratadine (CLARITIN) 10 MG tablet Take 10 mg by mouth daily. Takes generic    [provider]  propafenone (RYTHMOL) 150 MG tablet Take 150 mg by mouth every 8 (eight) hours.    [provider]  ramelteon (ROZEREM) 8 MG tablet Take 8 mg by mouth at bedtime.    [provider]  traMADol (ULTRAM) 50 MG tablet Take 1 tablet (50 mg total) by mouth every 6 (six) hours as needed. 04/28/20   Vanetta Mulders, MD  warfarin (COUMADIN) 5 MG tablet Take 5 mg by mouth daily. 5mg  m, w, f, Saturday and Sunday. 2.5mg  on Tuesday and thursday    [provider]  warfarin (COUMADIN) 5 MG tablet Take by mouth.    [provider]    Allergies    Crestor [rosuvastatin], Penicillins, and Sudafed [pseudoephedrine hcl]  Review of Systems   Review of Systems  All other systems reviewed and are negative.  Physical Exam Updated Vital Signs BP (!) 153/89 (BP Location: Right Arm)   Pulse 82   Temp 98.6 F (37 C)   Resp 20   SpO2 97%   Physical Exam Vitals and nursing note reviewed.  Constitutional:      General: He is not in acute distress.    Appearance: Normal appearance. He is not ill-appearing.  HENT:     Head: Normocephalic and atraumatic.  Pulmonary:     Effort: Pulmonary  effort is normal.  Skin:    General: Skin is warm and dry.     Comments: The right middle finger has a small area of avulsed tissue.  Bleeding is controlled now with direct pressure.  Neurological:     Mental Status: He is alert.    ED Results / Procedures / Treatments   Labs (all labs ordered are listed, but only abnormal results are displayed) Labs Reviewed - No data to display  EKG None  Radiology No results found.  Procedures Procedures   Medications Ordered in ED Medications  Tdap (BOOSTRIX) 5-2.5-18.5 LF-MCG/0.5 injection (1 mL Intramuscular Given 03/13/21 2331)    ED Course  I have reviewed the triage vital signs and the nursing notes.  Pertinent labs & imaging results that were available during my care of the patient were reviewed by me and considered in my medical decision making (see chart for details).    MDM Rules/Calculators/A&P  Patient with fingertip avulsion with a sharp knife.  Bleeding is controlled despite him being on Coumadin.  Patient will be placed in a dressing and splint.  To perform local wound care and return as needed for any problems.  Final Clinical Impression(s) / ED Diagnoses Final diagnoses:  None    Rx / DC Orders ED Discharge Orders     None        Geoffery Lyons, MD 03/13/21 2345

## 2021-03-14 NOTE — ED Notes (Signed)
Xeroform and nonadhesive dsg applied

## 2021-04-17 ENCOUNTER — Emergency Department (HOSPITAL_BASED_OUTPATIENT_CLINIC_OR_DEPARTMENT_OTHER): Payer: 59

## 2021-04-17 ENCOUNTER — Emergency Department (HOSPITAL_BASED_OUTPATIENT_CLINIC_OR_DEPARTMENT_OTHER)
Admission: EM | Admit: 2021-04-17 | Discharge: 2021-04-17 | Disposition: A | Discharge status: Home / Self Care | Payer: 59 | Attending: Emergency Medicine | Admitting: Emergency Medicine

## 2021-04-17 ENCOUNTER — Encounter (HOSPITAL_BASED_OUTPATIENT_CLINIC_OR_DEPARTMENT_OTHER): Payer: Self-pay

## 2021-04-17 DIAGNOSIS — Y92410 Unspecified street and highway as the place of occurrence of the external cause: Secondary | ICD-10-CM | POA: Insufficient documentation

## 2021-04-17 DIAGNOSIS — Z7901 Long term (current) use of anticoagulants: Secondary | ICD-10-CM | POA: Diagnosis not present

## 2021-04-17 DIAGNOSIS — M542 Cervicalgia: Secondary | ICD-10-CM | POA: Diagnosis present

## 2021-04-17 DIAGNOSIS — I4891 Unspecified atrial fibrillation: Secondary | ICD-10-CM | POA: Insufficient documentation

## 2021-04-17 DIAGNOSIS — M5090 Cervical disc disorder, unspecified, unspecified cervical region: Secondary | ICD-10-CM

## 2021-04-17 MED ORDER — CYCLOBENZAPRINE HCL 5 MG PO TABS: 5.0000 mg | ORAL_TABLET | Freq: Three times a day (TID) | ORAL | 0 refills | Status: AC | PRN

## 2021-04-17 NOTE — ED Notes (Signed)
Pt discharged home after verbalizing understanding of discharge instructions; nad noted. 

## 2021-04-17 NOTE — Discharge Instructions (Addendum)
Take tylenol for pain   Take flexeril for muscle spasms   See your doctor for follow up. 

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
## 2022-06-01 ENCOUNTER — Other Ambulatory Visit: Payer: Self-pay

## 2022-06-01 ENCOUNTER — Encounter (HOSPITAL_BASED_OUTPATIENT_CLINIC_OR_DEPARTMENT_OTHER): Payer: Self-pay | Admitting: Emergency Medicine

## 2022-06-01 ENCOUNTER — Emergency Department (HOSPITAL_BASED_OUTPATIENT_CLINIC_OR_DEPARTMENT_OTHER)
Admission: EM | Admit: 2022-06-01 | Discharge: 2022-06-01 | Disposition: A | Discharge status: Home / Self Care | Payer: Medicare Other | Attending: Emergency Medicine | Admitting: Emergency Medicine

## 2022-06-01 ENCOUNTER — Emergency Department (HOSPITAL_BASED_OUTPATIENT_CLINIC_OR_DEPARTMENT_OTHER): Payer: Medicare Other

## 2022-06-01 DIAGNOSIS — M79644 Pain in right finger(s): Secondary | ICD-10-CM | POA: Insufficient documentation

## 2022-06-01 DIAGNOSIS — J45909 Unspecified asthma, uncomplicated: Secondary | ICD-10-CM | POA: Insufficient documentation

## 2022-06-01 DIAGNOSIS — Z7951 Long term (current) use of inhaled steroids: Secondary | ICD-10-CM | POA: Insufficient documentation

## 2022-06-01 DIAGNOSIS — Z7901 Long term (current) use of anticoagulants: Secondary | ICD-10-CM | POA: Insufficient documentation

## 2022-06-01 DIAGNOSIS — R200 Anesthesia of skin: Secondary | ICD-10-CM | POA: Diagnosis not present

## 2022-06-01 DIAGNOSIS — M79641 Pain in right hand: Secondary | ICD-10-CM | POA: Diagnosis present

## 2022-06-01 HISTORY — DX: Unspecified asthma, uncomplicated: J45.909

## 2022-06-01 HISTORY — DX: Unspecified osteoarthritis, unspecified site: M19.90

## 2022-06-01 NOTE — Discharge Instructions (Addendum)
It was a pleasure taking care of you today.  As discussed, your x-ray did not show any broken bones.  Please follow-up with your orthopedic surgeon at your scheduled appointment.  You may take Tylenol as needed for pain.  Return to the ER for new or worsening symptoms.
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
## 2022-06-01 NOTE — ED Triage Notes (Signed)
Pain and numbness in right hand x 2 months. Has seen ortho for same and dx with carpel tunnel. Pain worse in right thumb.

## 2022-07-01 ENCOUNTER — Other Ambulatory Visit: Payer: Self-pay

## 2022-07-01 ENCOUNTER — Encounter (HOSPITAL_BASED_OUTPATIENT_CLINIC_OR_DEPARTMENT_OTHER): Payer: Self-pay | Admitting: Emergency Medicine

## 2022-07-01 DIAGNOSIS — Z5321 Procedure and treatment not carried out due to patient leaving prior to being seen by health care provider: Secondary | ICD-10-CM | POA: Diagnosis not present

## 2022-07-01 DIAGNOSIS — R21 Rash and other nonspecific skin eruption: Secondary | ICD-10-CM | POA: Insufficient documentation

## 2022-07-01 NOTE — ED Triage Notes (Addendum)
Pt reports he has had a rash to groin for several weeks; thinks he scrubbed too hard in the shower tonight and now RT testicle is bleeding; denies pain; pt on blood thinners; bleeding is minimal at this time

## 2022-07-02 ENCOUNTER — Emergency Department (HOSPITAL_BASED_OUTPATIENT_CLINIC_OR_DEPARTMENT_OTHER)
Admission: EM | Admit: 2022-07-02 | Discharge: 2022-07-02 | Discharge status: Against Medical Advice | Payer: Medicare Other | Attending: Emergency Medicine | Admitting: Emergency Medicine

## 2022-07-02 NOTE — ED Notes (Signed)
Pt not found in lobby when called for room.

## 2023-04-16 ENCOUNTER — Emergency Department (HOSPITAL_BASED_OUTPATIENT_CLINIC_OR_DEPARTMENT_OTHER)
Admission: EM | Admit: 2023-04-16 | Discharge: 2023-04-16 | Disposition: A | Discharge status: Home / Self Care | Payer: Medicare Other | Attending: Emergency Medicine | Admitting: Emergency Medicine

## 2023-04-16 ENCOUNTER — Encounter (HOSPITAL_BASED_OUTPATIENT_CLINIC_OR_DEPARTMENT_OTHER): Payer: Self-pay

## 2023-04-16 DIAGNOSIS — Z7901 Long term (current) use of anticoagulants: Secondary | ICD-10-CM | POA: Diagnosis not present

## 2023-04-16 DIAGNOSIS — N390 Urinary tract infection, site not specified: Secondary | ICD-10-CM | POA: Diagnosis not present

## 2023-04-16 DIAGNOSIS — R319 Hematuria, unspecified: Secondary | ICD-10-CM | POA: Diagnosis present

## 2023-04-16 LAB — CBC WITH DIFFERENTIAL/PLATELET
Abs Immature Granulocytes: 0.06 10*3/uL (ref 0.00–0.07)
Basophils Absolute: 0 10*3/uL (ref 0.0–0.1)
Basophils Relative: 0 %
Eosinophils Absolute: 0 10*3/uL (ref 0.0–0.5)
Eosinophils Relative: 0 %
HCT: 41.3 % (ref 39.0–52.0)
Hemoglobin: 14.2 g/dL (ref 13.0–17.0)
Immature Granulocytes: 0 %
Lymphocytes Relative: 10 %
Lymphs Abs: 1.4 10*3/uL (ref 0.7–4.0)
MCH: 30.7 pg (ref 26.0–34.0)
MCHC: 34.4 g/dL (ref 30.0–36.0)
MCV: 89.4 fL (ref 80.0–100.0)
Monocytes Absolute: 1.6 10*3/uL — ABNORMAL HIGH (ref 0.1–1.0)
Monocytes Relative: 12 %
Neutro Abs: 10.9 10*3/uL — ABNORMAL HIGH (ref 1.7–7.7)
Neutrophils Relative %: 78 %
Platelets: 153 10*3/uL (ref 150–400)
RBC: 4.62 MIL/uL (ref 4.22–5.81)
RDW: 12.6 % (ref 11.5–15.5)
WBC: 14 10*3/uL — ABNORMAL HIGH (ref 4.0–10.5)
nRBC: 0 % (ref 0.0–0.2)

## 2023-04-16 LAB — URINALYSIS, W/ REFLEX TO CULTURE (INFECTION SUSPECTED)
Bilirubin Urine: NEGATIVE
Glucose, UA: NEGATIVE mg/dL
Ketones, ur: NEGATIVE mg/dL
Nitrite: POSITIVE — AB
Protein, ur: 100 mg/dL — AB
Specific Gravity, Urine: 1.025 (ref 1.005–1.030)
pH: 6.5 (ref 5.0–8.0)

## 2023-04-16 LAB — BASIC METABOLIC PANEL
Anion gap: 8 (ref 5–15)
BUN: 20 mg/dL (ref 8–23)
CO2: 25 mmol/L (ref 22–32)
Calcium: 8.1 mg/dL — ABNORMAL LOW (ref 8.9–10.3)
Chloride: 105 mmol/L (ref 98–111)
Creatinine, Ser: 1.02 mg/dL (ref 0.61–1.24)
GFR, Estimated: >60 mL/min (ref >60)
Glucose, Bld: 109 mg/dL — ABNORMAL HIGH (ref 70–99)
Potassium: 3.5 mmol/L (ref 3.5–5.1)
Sodium: 138 mmol/L (ref 135–145)

## 2023-04-16 LAB — PROTIME-INR
INR: 1.5 — ABNORMAL HIGH (ref 0.8–1.2)
Prothrombin Time: 18 seconds — ABNORMAL HIGH (ref 11.4–15.2)

## 2023-04-16 MED ORDER — SODIUM CHLORIDE 0.9 % IV SOLN: INTRAVENOUS | Status: DC | PRN

## 2023-04-16 MED ORDER — CEPHALEXIN 500 MG PO CAPS: 500.0000 mg | ORAL_CAPSULE | Freq: Four times a day (QID) | ORAL | 0 refills | Status: AC

## 2023-04-16 MED ORDER — SODIUM CHLORIDE 0.9 % IV SOLN
2.0000 g | Freq: Once | INTRAVENOUS | Status: AC
  Administered 2023-04-16: 2 g via INTRAVENOUS
  Filled 2023-04-16: qty 20

## 2023-04-16 NOTE — ED Provider Notes (Signed)
EMERGENCY DEPARTMENT AT [facility] Provider Note   CSN: [id number] Arrival date & time: 04/16/23  6962     History  Chief Complaint  Patient presents with   Hematuria    Steven Montgomery is a 67 y.o. male.  He has a history of A-fib status post ablation, remains on Coumadin.  He said he woke up around midnight this morning to urinate and when he did he noticed some blood in the urine and had some burning with urination.  He had some dribbling urination on and off for the rest of the night.  No abdominal pain or back pain.  Today he felt like he was febrile and had some bodyaches.  He has a prior history of a UTI about 3 years ago.  No cough chest pain shortness of breath nausea vomiting diarrhea.  No other bleeding noted.  The history is provided by the patient.  Hematuria This is a new problem. The current episode started 6 to 12 hours ago. The problem has not changed since onset.Pertinent negatives include no chest pain, no abdominal pain, no headaches and no shortness of breath. Nothing aggravates the symptoms. Nothing relieves the symptoms. He has tried rest for the symptoms. The treatment provided no relief.       Home Medications Prior to Admission medications   Medication Sig Start Date End Date Taking? Authorizing Provider  Acetaminophen-Codeine 300-30 MG tablet Take 1 tablet by mouth 3 (three) times daily as needed. 11/28/16   [provider]  ascorbic acid (VITAMIN C) 100 MG tablet Take by mouth.    [provider]  atorvastatin (LIPITOR) 40 MG tablet Take by mouth. 04/17/17   [provider]  budesonide-formoterol (SYMBICORT) 160-4.5 MCG/ACT inhaler USE 2 INHALATIONS BY MOUTH  TWICE DAILY 08/24/19   [provider]  cephALEXin (KEFLEX) 500 MG capsule Take 1 capsule (500 mg total) by mouth 4 (four) times daily. 08/02/20   Farrel Gordon, PA-C  cyclobenzaprine (FLEXERIL) 5 MG tablet Take 1 tablet (5 mg total) by mouth 3  (three) times daily as needed for muscle spasms. 04/17/21   Charlynne Pander, MD  diltiazem (CARDIZEM) 30 MG tablet Take 30 mg by mouth.    [provider]  diltiazem (TIAZAC) 180 MG 24 hr capsule Take by mouth. 08/11/19   [provider]  dronedarone (MULTAQ) 400 MG tablet Take 1 tablet by mouth 2 (two) times daily. 09/21/19   [provider]  escitalopram (LEXAPRO) 20 MG tablet Take 20 mg by mouth daily.    [provider]  esomeprazole (NEXIUM) 40 MG packet Take 40 mg by mouth daily before breakfast.    [provider]  gabapentin (NEURONTIN) 300 MG capsule TAKE ONE TAB NIGHTLY AS NEEDED. MAY TAKE ONE TABLET THREE TIMES A DAY AS NEEDED 03/07/20   [provider]  loratadine (CLARITIN) 10 MG tablet Take 10 mg by mouth daily. Takes generic    [provider]  propafenone (RYTHMOL) 150 MG tablet Take 150 mg by mouth every 8 (eight) hours.    [provider]  ramelteon (ROZEREM) 8 MG tablet Take 8 mg by mouth at bedtime.    [provider]  traMADol (ULTRAM) 50 MG tablet Take 1 tablet (50 mg total) by mouth every 6 (six) hours as needed. 04/28/20   Vanetta Mulders, MD  warfarin (COUMADIN) 5 MG tablet Take 5 mg by mouth daily. 5mg  m, w, f, Saturday and Sunday. 2.5mg  on Tuesday and thursday  [provider]  warfarin (COUMADIN) 5 MG tablet Take by mouth.    [provider]      Allergies    Escitalopram oxalate, Pseudoephedrine hcl, Ezetimibe-simvastatin, Rosuvastatin, Sudafed [pseudoephedrine hcl], Penicillins, and Phenylephrine hcl    Review of Systems   Review of Systems  Constitutional:  Positive for fatigue and fever.  Respiratory:  Negative for shortness of breath.   Cardiovascular:  Negative for chest pain.  Gastrointestinal:  Negative for abdominal pain.  Genitourinary:  Positive for dysuria and hematuria. Negative for penile pain.  Neurological:  Negative for headaches.    Physical  Exam Updated Vital Signs BP (!) 143/76 (BP Location: Right Arm)   Pulse 91   Temp 99.8 F (37.7 C) (Oral)   Resp 18   Ht 6' 1.5" (1.867 m)   Wt 101.6 kg   SpO2 94%   BMI 29.15 kg/m  Physical Exam Vitals and nursing note reviewed.  Constitutional:      General: He is not in acute distress.    Appearance: Normal appearance. He is well-developed.  HENT:     Head: Normocephalic and atraumatic.  Eyes:     Conjunctiva/sclera: Conjunctivae normal.  Cardiovascular:     Rate and Rhythm: Normal rate and regular rhythm.     Heart sounds: No murmur heard. Pulmonary:     Effort: Pulmonary effort is normal. No respiratory distress.     Breath sounds: Normal breath sounds.  Abdominal:     Palpations: Abdomen is soft.     Tenderness: There is no abdominal tenderness. There is no guarding or rebound.  Musculoskeletal:        General: No deformity.     Cervical back: Neck supple.  Skin:    General: Skin is warm and dry.     Capillary Refill: Capillary refill takes less than 2 seconds.  Neurological:     General: No focal deficit present.     Mental Status: He is alert.     ED Results / Procedures / Treatments   Labs (all labs ordered are listed, but only abnormal results are displayed) Labs Reviewed  URINALYSIS, W/ REFLEX TO CULTURE (INFECTION SUSPECTED) - Abnormal; Notable for the following components:      Result Value   Hgb urine dipstick LARGE (*)    Protein, ur 100 (*)    Nitrite POSITIVE (*)    Leukocytes,Ua MODERATE (*)    Bacteria, UA FEW (*)    All other components within normal limits  BASIC METABOLIC PANEL - Abnormal; Notable for the following components:   Glucose, Bld 109 (*)    Calcium 8.1 (*)    All other components within normal limits  CBC WITH DIFFERENTIAL/PLATELET - Abnormal; Notable for the following components:   WBC 14.0 (*)    Neutro Abs 10.9 (*)    Monocytes Absolute 1.6 (*)    All other components within normal limits  PROTIME-INR - Abnormal;  Notable for the following components:   Prothrombin Time 18.0 (*)    INR 1.5 (*)    All other components within normal limits  URINE CULTURE    EKG None  Radiology No results found.  Procedures Procedures    Medications Ordered in ED Medications  cefTRIAXone (ROCEPHIN) 2 g in sodium chloride 0.9 % 100 mL IVPB (0 g Intravenous Stopped 04/16/23 0933)    ED Course/ Medical Decision Making/ A&P Clinical Course as of 04/16/23 1702  Tue Apr 16, 2023  8295 Urinalysis showing 21-50 whites nitrite  positive few bacteria.  Will give an IV dose of antibiotics and likely can be discharged on oral antibiotics [MB]    Clinical Course User Index [MB] Terrilee Files, MD                                 Medical Decision Making Amount and/or Complexity of Data Reviewed Labs: ordered.  Risk Prescription drug management.   This patient complains of hematuria dysuria frequency; this involves an extensive number of treatment Options and is a complaint that carries with it a high risk of complications and morbidity. The differential includes UTI, renal colic, tumor, mass  I ordered, reviewed and interpreted labs, which included CBC with elevated white count stable hemoglobin, chemistries with normal renal function, urinalysis probable signs of infection sent for culture I ordered medication IV antibiotics and reviewed PMP when indicated. Previous records obtained and reviewed in epic no recent admissions Cardiac monitoring reviewed, normal sinus rhythm Social determinants considered, no significant barriers Critical Interventions: None  After the interventions stated above, I reevaluated the patient and found patient to be well-appearing in no distress Admission and further testing considered, no indications for admission or further workup at this time.  Will cover with oral antibiotics and send urine for culture.  Recommended close follow-up with PCP.  Return instructions  discussed         Final Clinical Impression(s) / ED Diagnoses Final diagnoses:  Hematuria, unspecified type  Lower urinary tract infectious disease    Rx / DC Orders ED Discharge Orders          Ordered    cephALEXin (KEFLEX) 500 MG capsule  4 times daily        04/16/23 0910              Terrilee Files, MD 04/16/23 1704

## 2023-04-16 NOTE — Discharge Instructions (Signed)
You were seen in the emergency department for some blood in your urine and burning with urination fever.  Your urinalysis showed signs of infection.  You were given IV antibiotics and are being sent home with a prescription for oral antibiotics.  You can start these tomorrow.  Please drink plenty of fluids.  Follow-up with your primary care doctor.  Your INR was also checked and was 1.5.  Your warfarin dose may need to be adjusted.

## 2023-04-16 NOTE — ED Triage Notes (Signed)
Pt reports that last night he went to the restroom and noted blood in urine. States that he also noted burning while urinating.

## 2023-04-19 ENCOUNTER — Telehealth (HOSPITAL_BASED_OUTPATIENT_CLINIC_OR_DEPARTMENT_OTHER): Payer: Self-pay | Admitting: *Deleted

## 2023-04-19 NOTE — Telephone Encounter (Signed)
Post ED Visit - Positive Culture Follow-up  Culture report reviewed by antimicrobial stewardship pharmacist: [facility] [x]  [facility], [place].D. []  Celedonio Miyamoto, Pharm.D., BCPS AQ-ID []  Garvin Fila, Pharm.D., BCPS []  Georgina Pillion, Pharm.D., BCPS []  [place], [address].D., BCPS, AAHIVP []  Estella Husk, Pharm.D., BCPS, AAHIVP []  Lysle Pearl, PharmD, BCPS []  Phillips Climes, PharmD, BCPS []  Agapito Games, PharmD, BCPS []  Verlan Friends, PharmD []  Mervyn Gay, PharmD, BCPS []  Vinnie Level, PharmD  [facility] []  Len Childs, PharmD []  Greer Pickerel, PharmD []  Adalberto Cole, PharmD []  Perlie Gold, Rph []  Lonell Face) Jean Rosenthal, PharmD []  Earl Many, PharmD []  Junita Push, PharmD []  Dorna Leitz, PharmD []  Terrilee Files, PharmD []  Lynann Beaver, PharmD []  Keturah Barre, PharmD []  Loralee Pacas, PharmD []  Bernadene Person, PharmD   Positive urine culture Treated with cephalexin, organism sensitive to the same and no further patient follow-up is required at this time.  Bing Quarry 04/19/2023, 8:36 AM
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
## 2024-09-06 ENCOUNTER — Encounter (HOSPITAL_BASED_OUTPATIENT_CLINIC_OR_DEPARTMENT_OTHER): Payer: Self-pay | Admitting: Emergency Medicine

## 2024-09-06 ENCOUNTER — Emergency Department (HOSPITAL_BASED_OUTPATIENT_CLINIC_OR_DEPARTMENT_OTHER)
Admission: EM | Admit: 2024-09-06 | Discharge: 2024-09-06 | Disposition: A | Discharge status: Home / Self Care | Attending: Emergency Medicine | Admitting: Emergency Medicine

## 2024-09-06 ENCOUNTER — Other Ambulatory Visit: Payer: Self-pay

## 2024-09-06 DIAGNOSIS — H109 Unspecified conjunctivitis: Secondary | ICD-10-CM | POA: Insufficient documentation

## 2024-09-06 DIAGNOSIS — Z7901 Long term (current) use of anticoagulants: Secondary | ICD-10-CM | POA: Insufficient documentation

## 2024-09-06 MED ORDER — ERYTHROMYCIN 5 MG/GM OP OINT: TOPICAL_OINTMENT | Freq: Four times a day (QID) | OPHTHALMIC | 0 refills | Status: AC

## 2024-09-06 MED ORDER — ERYTHROMYCIN 5 MG/GM OP OINT: TOPICAL_OINTMENT | OPHTHALMIC | 0 refills | Status: DC

## 2024-09-06 NOTE — ED Triage Notes (Signed)
 Pt c/o LT eye redness and irritation since this afternoon

## 2024-09-06 NOTE — Discharge Instructions (Addendum)
 You have conjunctivitis.  I have sent erythromycin  ointment into the pharmacy for you.  For any worsening symptoms return to the emergency department.
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
